# Patient Record
Sex: Female | Born: 1935 | Race: White | Hispanic: No | Marital: Married | Smoking: Never smoker
Health system: Southern US, Community
[De-identification: ages and names within clinical notes are randomized; demographics above are authoritative.]

## PROBLEM LIST (undated history)

## (undated) DIAGNOSIS — H409 Unspecified glaucoma: Secondary | ICD-10-CM

## (undated) DIAGNOSIS — E039 Hypothyroidism, unspecified: Secondary | ICD-10-CM

## (undated) DIAGNOSIS — I10 Essential (primary) hypertension: Secondary | ICD-10-CM

## (undated) DIAGNOSIS — E7800 Pure hypercholesterolemia, unspecified: Secondary | ICD-10-CM

## (undated) DIAGNOSIS — G629 Polyneuropathy, unspecified: Secondary | ICD-10-CM

## (undated) DIAGNOSIS — R002 Palpitations: Secondary | ICD-10-CM

## (undated) DIAGNOSIS — M810 Age-related osteoporosis without current pathological fracture: Secondary | ICD-10-CM

## (undated) HISTORY — PX: CLAVICLE SURGERY: SHX598

## (undated) HISTORY — PX: TONSILLECTOMY: SUR1361

## (undated) HISTORY — PX: COLONOSCOPY: SHX174

---

## 2004-09-10 ENCOUNTER — Ambulatory Visit: Payer: Self-pay | Admitting: Internal Medicine

## 2005-09-17 ENCOUNTER — Ambulatory Visit: Payer: Self-pay | Admitting: Internal Medicine

## 2006-09-24 ENCOUNTER — Ambulatory Visit: Payer: Self-pay | Admitting: Internal Medicine

## 2007-09-25 ENCOUNTER — Ambulatory Visit: Payer: Self-pay | Admitting: Internal Medicine

## 2008-09-27 ENCOUNTER — Ambulatory Visit: Payer: Self-pay | Admitting: Internal Medicine

## 2009-05-17 ENCOUNTER — Ambulatory Visit: Payer: Self-pay | Admitting: Podiatry

## 2009-09-28 ENCOUNTER — Ambulatory Visit: Payer: Self-pay | Admitting: Internal Medicine

## 2011-01-30 ENCOUNTER — Ambulatory Visit: Payer: Self-pay | Admitting: Internal Medicine

## 2012-02-03 ENCOUNTER — Ambulatory Visit: Payer: Self-pay | Admitting: Internal Medicine

## 2013-02-03 ENCOUNTER — Ambulatory Visit: Payer: Self-pay | Admitting: Internal Medicine

## 2014-02-07 ENCOUNTER — Ambulatory Visit: Payer: Self-pay | Admitting: Internal Medicine

## 2014-09-12 ENCOUNTER — Other Ambulatory Visit: Payer: Self-pay | Admitting: Internal Medicine

## 2014-09-12 DIAGNOSIS — Z1231 Encounter for screening mammogram for malignant neoplasm of breast: Secondary | ICD-10-CM

## 2015-01-25 DIAGNOSIS — H401132 Primary open-angle glaucoma, bilateral, moderate stage: Secondary | ICD-10-CM | POA: Diagnosis not present

## 2015-02-14 ENCOUNTER — Ambulatory Visit
Admission: RE | Admit: 2015-02-14 | Discharge: 2015-02-14 | Disposition: A | Discharge status: Home / Self Care | Payer: PPO | Source: Ambulatory Visit | Attending: Internal Medicine | Admitting: Internal Medicine

## 2015-02-14 DIAGNOSIS — Z1231 Encounter for screening mammogram for malignant neoplasm of breast: Secondary | ICD-10-CM | POA: Insufficient documentation

## 2015-03-27 DIAGNOSIS — J40 Bronchitis, not specified as acute or chronic: Secondary | ICD-10-CM | POA: Diagnosis not present

## 2015-04-06 DIAGNOSIS — E782 Mixed hyperlipidemia: Secondary | ICD-10-CM | POA: Diagnosis not present

## 2015-04-06 DIAGNOSIS — S92309D Fracture of unspecified metatarsal bone(s), unspecified foot, subsequent encounter for fracture with routine healing: Secondary | ICD-10-CM | POA: Diagnosis not present

## 2015-04-06 DIAGNOSIS — G609 Hereditary and idiopathic neuropathy, unspecified: Secondary | ICD-10-CM | POA: Diagnosis not present

## 2015-04-12 DIAGNOSIS — I10 Essential (primary) hypertension: Secondary | ICD-10-CM | POA: Diagnosis not present

## 2015-04-12 DIAGNOSIS — E782 Mixed hyperlipidemia: Secondary | ICD-10-CM | POA: Diagnosis not present

## 2015-07-31 DIAGNOSIS — H401132 Primary open-angle glaucoma, bilateral, moderate stage: Secondary | ICD-10-CM | POA: Diagnosis not present

## 2015-08-29 DIAGNOSIS — H401132 Primary open-angle glaucoma, bilateral, moderate stage: Secondary | ICD-10-CM | POA: Diagnosis not present

## 2015-10-10 DIAGNOSIS — E782 Mixed hyperlipidemia: Secondary | ICD-10-CM | POA: Diagnosis not present

## 2015-10-10 DIAGNOSIS — I10 Essential (primary) hypertension: Secondary | ICD-10-CM | POA: Diagnosis not present

## 2015-10-18 ENCOUNTER — Other Ambulatory Visit: Payer: Self-pay | Admitting: Internal Medicine

## 2015-10-18 DIAGNOSIS — Z0000 Encounter for general adult medical examination without abnormal findings: Secondary | ICD-10-CM | POA: Diagnosis not present

## 2015-10-18 DIAGNOSIS — Z1231 Encounter for screening mammogram for malignant neoplasm of breast: Secondary | ICD-10-CM

## 2015-10-18 DIAGNOSIS — Z23 Encounter for immunization: Secondary | ICD-10-CM | POA: Diagnosis not present

## 2016-02-15 ENCOUNTER — Ambulatory Visit
Admission: RE | Admit: 2016-02-15 | Discharge: 2016-02-15 | Disposition: A | Discharge status: Home / Self Care | Payer: PPO | Source: Ambulatory Visit | Attending: Internal Medicine | Admitting: Internal Medicine

## 2016-02-15 DIAGNOSIS — Z1231 Encounter for screening mammogram for malignant neoplasm of breast: Secondary | ICD-10-CM | POA: Diagnosis not present

## 2016-02-28 DIAGNOSIS — H401132 Primary open-angle glaucoma, bilateral, moderate stage: Secondary | ICD-10-CM | POA: Diagnosis not present

## 2016-03-13 DIAGNOSIS — H2513 Age-related nuclear cataract, bilateral: Secondary | ICD-10-CM | POA: Diagnosis not present

## 2016-03-26 ENCOUNTER — Encounter: Payer: Self-pay | Admitting: *Deleted

## 2016-03-29 NOTE — Discharge Instructions (Signed)
Cataract Surgery, Care After °Refer to this sheet in the next few weeks. These instructions provide you with information about caring for yourself after your procedure. Your health care provider may also give you more specific instructions. Your treatment has been planned according to current medical practices, but problems sometimes occur. Call your health care provider if you have any problems or questions after your procedure. °What can I expect after the procedure? °After the procedure, it is common to have: °· Itching. °· Discomfort. °· Fluid discharge. °· Sensitivity to light and to touch. °· Bruising. °Follow these instructions at home: °Eye Care  °· Check your eye every day for signs of infection. Watch for: °¨ Redness, swelling, or pain. °¨ Fluid, blood, or pus. °¨ Warmth. °¨ Bad smell. °Activity  °· Avoid strenuous activities, such as playing contact sports, for as long as told by your health care provider. °· Do not drive or operate heavy machinery until your health care provider approves. °· Do not bend or lift heavy objects . Bending increases pressure in the eye. You can walk, climb stairs, and do light household chores. °· Ask your health care provider when you can return to work. If you work in a dusty environment, you may be advised to wear protective eyewear for a period of time. °General instructions  °· Take or apply over-the-counter and prescription medicines only as told by your health care provider. This includes eye drops. °· Do not touch or rub your eyes. °· If you were given a protective shield, wear it as told by your health care provider. If you were not given a protective shield, wear sunglasses as told by your health care provider to protect your eyes. °· Keep the area around your eye clean and dry. Avoid swimming or allowing water to hit you directly in the face while showering until told by your health care provider. Keep soap and shampoo out of your eyes. °· Do not put a contact lens  into the affected eye or eyes until your health care provider approves. °· Keep all follow-up visits as told by your health care provider. This is important. °Contact a health care provider if: ° °· You have increased bruising around your eye. °· You have pain that is not helped with medicine. °· You have a fever. °· You have redness, swelling, or pain in your eye. °· You have fluid, blood, or pus coming from your incision. °· Your vision gets worse. °Get help right away if: °· You have sudden vision loss. °This information is not intended to replace advice given to you by your health care provider. Make sure you discuss any questions you have with your health care provider. °Document Released: 07/27/2004 Document Revised: 05/18/2015 Document Reviewed: 11/17/2014 °Elsevier Interactive Patient Education © 2017 [employer] ° ° ° ° °General Anesthesia, Adult, Care After °These instructions provide you with information about caring for yourself after your procedure. Your health care provider may also give you more specific instructions. Your treatment has been planned according to current medical practices, but problems sometimes occur. Call your health care provider if you have any problems or questions after your procedure. °What can I expect after the procedure? °After the procedure, it is common to have: °· Vomiting. °· A sore throat. °· Mental slowness. °It is common to feel: °· Nauseous. °· Cold or shivery. °· Sleepy. °· Tired. °· Sore or achy, even in parts of your body where you did not have surgery. °Follow these instructions at   home: °For at least 24 hours after the procedure:  °· Do not: °¨ Participate in activities where you could fall or become injured. °¨ Drive. °¨ Use heavy machinery. °¨ Drink alcohol. °¨ Take sleeping pills or medicines that cause drowsiness. °¨ Make important decisions or sign legal documents. °¨ Take care of children on your own. °· Rest. °Eating and drinking  °· If you vomit, drink  water, juice, or soup when you can drink without vomiting. °· Drink enough fluid to keep your urine clear or pale yellow. °· Make sure you have little or no nausea before eating solid foods. °· Follow the diet recommended by your health care provider. °General instructions  °· Have a responsible adult stay with you until you are awake and alert. °· Return to your normal activities as told by your health care provider. Ask your health care provider what activities are safe for you. °· Take over-the-counter and prescription medicines only as told by your health care provider. °· If you smoke, do not smoke without supervision. °· Keep all follow-up visits as told by your health care provider. This is important. °Contact a health care provider if: °· You continue to have nausea or vomiting at home, and medicines are not helpful. °· You cannot drink fluids or start eating again. °· You cannot urinate after 8-12 hours. °· You develop a skin rash. °· You have fever. °· You have increasing redness at the site of your procedure. °Get help right away if: °· You have difficulty breathing. °· You have chest pain. °· You have unexpected bleeding. °· You feel that you are having a life-threatening or urgent problem. °This information is not intended to replace advice given to you by your health care provider. Make sure you discuss any questions you have with your health care provider. °Document Released: 04/15/2000 Document Revised: 06/12/2015 Document Reviewed: 12/22/2014 °Elsevier Interactive Patient Education © 2017 [employer] ° °

## 2016-04-01 ENCOUNTER — Ambulatory Visit: Payer: PPO | Admitting: Anesthesiology

## 2016-04-01 ENCOUNTER — Encounter
Admission: RE | Disposition: A | Discharge status: Home / Self Care | Payer: Self-pay | Source: Ambulatory Visit | Attending: Ophthalmology

## 2016-04-01 ENCOUNTER — Ambulatory Visit
Admission: RE | Admit: 2016-04-01 | Discharge: 2016-04-01 | Disposition: A | Discharge status: Home / Self Care | Payer: PPO | Source: Ambulatory Visit | Attending: Ophthalmology | Admitting: Ophthalmology

## 2016-04-01 DIAGNOSIS — Z882 Allergy status to sulfonamides status: Secondary | ICD-10-CM | POA: Insufficient documentation

## 2016-04-01 DIAGNOSIS — Z888 Allergy status to other drugs, medicaments and biological substances status: Secondary | ICD-10-CM | POA: Diagnosis not present

## 2016-04-01 DIAGNOSIS — I499 Cardiac arrhythmia, unspecified: Secondary | ICD-10-CM | POA: Diagnosis not present

## 2016-04-01 DIAGNOSIS — Z881 Allergy status to other antibiotic agents status: Secondary | ICD-10-CM | POA: Insufficient documentation

## 2016-04-01 DIAGNOSIS — H2513 Age-related nuclear cataract, bilateral: Secondary | ICD-10-CM | POA: Diagnosis not present

## 2016-04-01 DIAGNOSIS — E039 Hypothyroidism, unspecified: Secondary | ICD-10-CM | POA: Insufficient documentation

## 2016-04-01 DIAGNOSIS — M1990 Unspecified osteoarthritis, unspecified site: Secondary | ICD-10-CM | POA: Diagnosis not present

## 2016-04-01 DIAGNOSIS — Z880 Allergy status to penicillin: Secondary | ICD-10-CM | POA: Insufficient documentation

## 2016-04-01 DIAGNOSIS — M810 Age-related osteoporosis without current pathological fracture: Secondary | ICD-10-CM | POA: Insufficient documentation

## 2016-04-01 DIAGNOSIS — I10 Essential (primary) hypertension: Secondary | ICD-10-CM | POA: Insufficient documentation

## 2016-04-01 DIAGNOSIS — E7800 Pure hypercholesterolemia, unspecified: Secondary | ICD-10-CM | POA: Diagnosis not present

## 2016-04-01 DIAGNOSIS — R002 Palpitations: Secondary | ICD-10-CM | POA: Insufficient documentation

## 2016-04-01 DIAGNOSIS — H2511 Age-related nuclear cataract, right eye: Secondary | ICD-10-CM | POA: Diagnosis not present

## 2016-04-01 HISTORY — DX: Pure hypercholesterolemia, unspecified: E78.00

## 2016-04-01 HISTORY — DX: Hypothyroidism, unspecified: E03.9

## 2016-04-01 HISTORY — DX: Age-related osteoporosis without current pathological fracture: M81.0

## 2016-04-01 HISTORY — DX: Essential (primary) hypertension: I10

## 2016-04-01 HISTORY — PX: CATARACT EXTRACTION W/PHACO: SHX586

## 2016-04-01 HISTORY — DX: Polyneuropathy, unspecified: G62.9

## 2016-04-01 HISTORY — DX: Unspecified glaucoma: H40.9

## 2016-04-01 HISTORY — DX: Palpitations: R00.2

## 2016-04-01 SURGERY — PHACOEMULSIFICATION, CATARACT, WITH IOL INSERTION
Anesthesia: Monitor Anesthesia Care | Site: Eye | Laterality: Right | Wound class: Clean

## 2016-04-01 MED ORDER — EPINEPHRINE PF 1 MG/ML IJ SOLN
INTRAOCULAR | Status: DC | PRN
  Administered 2016-04-01: 123 mL via OPHTHALMIC

## 2016-04-01 MED ORDER — FENTANYL CITRATE (PF) 100 MCG/2ML IJ SOLN
INTRAMUSCULAR | Status: DC | PRN
  Administered 2016-04-01: 50 ug via INTRAVENOUS

## 2016-04-01 MED ORDER — ACETAMINOPHEN 325 MG PO TABS: 325.0000 mg | ORAL_TABLET | ORAL | Status: DC | PRN

## 2016-04-01 MED ORDER — MIDAZOLAM HCL 2 MG/2ML IJ SOLN
INTRAMUSCULAR | Status: DC | PRN
  Administered 2016-04-01: 2 mg via INTRAVENOUS

## 2016-04-01 MED ORDER — LIDOCAINE HCL (PF) 2 % IJ SOLN
INTRAMUSCULAR | Status: DC | PRN
  Administered 2016-04-01: 1 mL

## 2016-04-01 MED ORDER — BRIMONIDINE TARTRATE-TIMOLOL 0.2-0.5 % OP SOLN
OPHTHALMIC | Status: DC | PRN
  Administered 2016-04-01: 1 [drp] via OPHTHALMIC

## 2016-04-01 MED ORDER — NA HYALUR & NA CHOND-NA HYALUR 0.4-0.35 ML IO KIT
PACK | INTRAOCULAR | Status: DC | PRN
  Administered 2016-04-01: 1 mL via INTRAOCULAR

## 2016-04-01 MED ORDER — MOXIFLOXACIN HCL 0.5 % OP SOLN
OPHTHALMIC | Status: DC | PRN
  Administered 2016-04-01: .3 mL via OPHTHALMIC

## 2016-04-01 MED ORDER — ACETAMINOPHEN 160 MG/5ML PO SOLN: 325.0000 mg | ORAL | Status: DC | PRN

## 2016-04-01 MED ORDER — ARMC OPHTHALMIC DILATING DROPS
1.0000 "application " | OPHTHALMIC | Status: DC | PRN
  Administered 2016-04-01 (×3): 1 via OPHTHALMIC

## 2016-04-01 MED ORDER — MOXIFLOXACIN HCL 0.5 % OP SOLN
1.0000 [drp] | OPHTHALMIC | Status: DC | PRN
  Administered 2016-04-01 (×3): 1 [drp] via OPHTHALMIC

## 2016-04-01 SURGICAL SUPPLY — 23 items
APPLICATOR COTTON TIP 3IN (MISCELLANEOUS) ×3 IMPLANT
CANNULA ANT/CHMB 27GA (MISCELLANEOUS) ×3 IMPLANT
DISSECTOR HYDRO NUCLEUS 50X22 (MISCELLANEOUS) ×3 IMPLANT
GLOVE BIO SURGEON STRL SZ7 (GLOVE) ×3 IMPLANT
GLOVE SURG LX 6.5 MICRO (GLOVE) ×2
GLOVE SURG LX STRL 6.5 MICRO (GLOVE) ×1 IMPLANT
GOWN STRL REUS W/ TWL LRG LVL3 (GOWN DISPOSABLE) ×2 IMPLANT
GOWN STRL REUS W/TWL LRG LVL3 (GOWN DISPOSABLE) ×4
LENS IOL ACRSF IQ ULTRA 24.5 (Intraocular Lens) ×1 IMPLANT
LENS IOL ACRYSOF IQ 24.5 (Intraocular Lens) ×3 IMPLANT
MARKER SKIN DUAL TIP RULER LAB (MISCELLANEOUS) ×3 IMPLANT
PACK CATARACT BRASINGTON (MISCELLANEOUS) ×3 IMPLANT
PACK EYE AFTER SURG (MISCELLANEOUS) ×3 IMPLANT
PACK OPTHALMIC (MISCELLANEOUS) ×3 IMPLANT
RING MALYGIN 7.0 (MISCELLANEOUS) IMPLANT
SUT ETHILON 10-0 CS-B-6CS-B-6 (SUTURE)
SUT VICRYL  9 0 (SUTURE)
SUT VICRYL 9 0 (SUTURE) IMPLANT
SUTURE EHLN 10-0 CS-B-6CS-B-6 (SUTURE) IMPLANT
SYR TB 1ML LUER SLIP (SYRINGE) ×3 IMPLANT
WATER STERILE IRR 250ML POUR (IV SOLUTION) ×3 IMPLANT
WICK EYE OCUCEL (MISCELLANEOUS) IMPLANT
WIPE NON LINTING 3.25X3.25 (MISCELLANEOUS) ×3 IMPLANT

## 2016-04-01 NOTE — Anesthesia Procedure Notes (Signed)
Procedure Name: MAC Performed by: Mahogany Torrance Pre-anesthesia Checklist: Patient identified, Emergency Drugs available, Suction available, Timeout performed and Patient being monitored Patient Re-evaluated:Patient Re-evaluated prior to inductionOxygen Delivery Method: Nasal cannula Placement Confirmation: positive ETCO2     

## 2016-04-01 NOTE — Transfer of Care (Signed)
Immediate Anesthesia Transfer of Care Note  Patient: Erika Carter  Procedure(s) Performed: Procedure(s): CATARACT EXTRACTION PHACO AND INTRAOCULAR LENS PLACEMENT (IOC)  Right (Right)  Patient Location: PACU  Anesthesia Type: MAC  Level of Consciousness: awake, alert  and patient cooperative  Airway and Oxygen Therapy: Patient Spontanous Breathing and Patient connected to supplemental oxygen  Post-op Assessment: Post-op Vital signs reviewed, Patient's Cardiovascular Status Stable, Respiratory Function Stable, Patent Airway and No signs of Nausea or vomiting  Post-op Vital Signs: Reviewed and stable  Complications: No apparent anesthesia complications

## 2016-04-01 NOTE — Anesthesia Postprocedure Evaluation (Signed)
Anesthesia Post Note  Patient: Erika Carter  Procedure(s) Performed: Procedure(s) (LRB): CATARACT EXTRACTION PHACO AND INTRAOCULAR LENS PLACEMENT (IOC)  Right (Right)  Patient location during evaluation: PACU Anesthesia Type: MAC Level of consciousness: awake and alert Pain management: pain level controlled Vital Signs Assessment: post-procedure vital signs reviewed and stable Respiratory status: spontaneous breathing, nonlabored ventilation, respiratory function stable and patient connected to nasal cannula oxygen Cardiovascular status: stable and blood pressure returned to baseline Anesthetic complications: no    Alisa Graff

## 2016-04-01 NOTE — Op Note (Signed)
Date of Surgery: 04/01/2016  PREOPERATIVE DIAGNOSES: Visually significant nuclear sclerotic cataract, right eye.  POSTOPERATIVE DIAGNOSES: Same  PROCEDURES PERFORMED: Cataract extraction with intraocular lens implant, right eye.  SURGEON: Almon Hercules, M.D.  ANESTHESIA: MAC and topical  IMPLANTS: [id number] +24.5 D  Implant Name Type Inv. Item Serial No. Manufacturer Lot No. LRB No. Used  LENS IOL ACRYSOF IQ 24.5 - [id number] Intraocular Lens LENS IOL ACRYSOF IQ 24.5 [id number] [employer]  Right 1  LENS IOL ACRYSOF IQ 24.5 - [id number] Intraocular Lens LENS IOL ACRYSOF IQ 24.5 [id number] ALCON   Right 1   Serial number [id number] was not implanted due to extended haptic prior to injection.   COMPLICATIONS: None.  DESCRIPTION OF PROCEDURE: Therapeutic options were discussed with the patient preoperatively, including a discussion of risks and benefits of surgery. Informed consent was obtained. An IOL-Master and immersion biometry were used to take the lens measurements, and a dilated fundus exam was performed within 6 months of the surgical date.  The patient was premedicated and brought to the operating room and placed on the operating table in the supine position. After adequate anesthesia, the patient was prepped and draped in the usual sterile ophthalmic fashion. A wire lid speculum was inserted and the microscope was positioned. A Superblade was used to create a paracentesis site at the limbus and a small amount of dilute preservative free lidocaine was instilled into the anterior chamber, followed by dispersive viscoelastic. A clear corneal incision was created temporally using a 2.4 mm keratome blade. Capsulorrhexis was then performed. In situ phacoemulsification was performed.  Cortical material was removed with the irrigation-aspiration unit. Dispersive viscoelastic was instilled to open the capsular bag. A posterior chamber intraocular lens with the specifications above was inserted  and positioned. Irrigation-aspiration was used to remove all viscoelastic. Cefuroxime 1cc was instilled into the anterior chamber, and the corneal incision was checked and found to be water tight. The eyelid speculum was removed.  The operative eye was covered with protective goggles after instilling 1 drop of timolol and brimonidine. The patient tolerated the procedure well. There were no complications.

## 2016-04-01 NOTE — Anesthesia Preprocedure Evaluation (Signed)
Anesthesia Evaluation  Patient identified by MRN, date of birth, ID band Patient awake    Reviewed: Allergy & Precautions, H&P , NPO status , Patient's Chart, lab work & pertinent test results, reviewed documented beta blocker date and time   Airway Mallampati: II  TM Distance: >3 FB Neck ROM: full    Dental no notable dental hx.    Pulmonary neg pulmonary ROS,    Pulmonary exam normal breath sounds clear to auscultation       Cardiovascular Exercise Tolerance: Good hypertension,  Rhythm:regular Rate:Normal     Neuro/Psych negative neurological ROS  negative psych ROS   GI/Hepatic negative GI ROS, Neg liver ROS,   Endo/Other  Hypothyroidism   Renal/GU negative Renal ROS  negative genitourinary   Musculoskeletal   Abdominal   Peds  Hematology negative hematology ROS (+)   Anesthesia Other Findings   Reproductive/Obstetrics negative OB ROS                             Anesthesia Physical Anesthesia Plan  ASA: II  Anesthesia Plan: MAC   Post-op Pain Management:    Induction:   Airway Management Planned:   Additional Equipment:   Intra-op Plan:   Post-operative Plan:   Informed Consent: I have reviewed the patients History and Physical, chart, labs and discussed the procedure including the risks, benefits and alternatives for the proposed anesthesia with the patient or authorized representative who has indicated his/her understanding and acceptance.   Dental Advisory Given  Plan Discussed with: CRNA  Anesthesia Plan Comments:         Anesthesia Quick Evaluation

## 2016-04-02 ENCOUNTER — Encounter: Payer: Self-pay | Admitting: Ophthalmology

## 2016-04-11 DIAGNOSIS — E782 Mixed hyperlipidemia: Secondary | ICD-10-CM | POA: Diagnosis not present

## 2016-04-11 DIAGNOSIS — Z0000 Encounter for general adult medical examination without abnormal findings: Secondary | ICD-10-CM | POA: Diagnosis not present

## 2016-04-18 DIAGNOSIS — E039 Hypothyroidism, unspecified: Secondary | ICD-10-CM | POA: Diagnosis not present

## 2016-04-18 DIAGNOSIS — E782 Mixed hyperlipidemia: Secondary | ICD-10-CM | POA: Diagnosis not present

## 2016-04-18 DIAGNOSIS — G609 Hereditary and idiopathic neuropathy, unspecified: Secondary | ICD-10-CM | POA: Diagnosis not present

## 2016-04-24 DIAGNOSIS — H2512 Age-related nuclear cataract, left eye: Secondary | ICD-10-CM | POA: Diagnosis not present

## 2016-04-30 ENCOUNTER — Encounter: Payer: Self-pay | Admitting: *Deleted

## 2016-05-03 NOTE — Discharge Instructions (Signed)
Cataract Surgery, Care After °Refer to this sheet in the next few weeks. These instructions provide you with information about caring for yourself after your procedure. Your health care provider may also give you more specific instructions. Your treatment has been planned according to current medical practices, but problems sometimes occur. Call your health care provider if you have any problems or questions after your procedure. °What can I expect after the procedure? °After the procedure, it is common to have: °· Itching. °· Discomfort. °· Fluid discharge. °· Sensitivity to light and to touch. °· Bruising. °Follow these instructions at home: °Eye Care  °· Check your eye every day for signs of infection. Watch for: °¨ Redness, swelling, or pain. °¨ Fluid, blood, or pus. °¨ Warmth. °¨ Bad smell. °Activity  °· Avoid strenuous activities, such as playing contact sports, for as long as told by your health care provider. °· Do not drive or operate heavy machinery until your health care provider approves. °· Do not bend or lift heavy objects . Bending increases pressure in the eye. You can walk, climb stairs, and do light household chores. °· Ask your health care provider when you can return to work. If you work in a dusty environment, you may be advised to wear protective eyewear for a period of time. °General instructions  °· Take or apply over-the-counter and prescription medicines only as told by your health care provider. This includes eye drops. °· Do not touch or rub your eyes. °· If you were given a protective shield, wear it as told by your health care provider. If you were not given a protective shield, wear sunglasses as told by your health care provider to protect your eyes. °· Keep the area around your eye clean and dry. Avoid swimming or allowing water to hit you directly in the face while showering until told by your health care provider. Keep soap and shampoo out of your eyes. °· Do not put a contact lens  into the affected eye or eyes until your health care provider approves. °· Keep all follow-up visits as told by your health care provider. This is important. °Contact a health care provider if: ° °· You have increased bruising around your eye. °· You have pain that is not helped with medicine. °· You have a fever. °· You have redness, swelling, or pain in your eye. °· You have fluid, blood, or pus coming from your incision. °· Your vision gets worse. °Get help right away if: °· You have sudden vision loss. °This information is not intended to replace advice given to you by your health care provider. Make sure you discuss any questions you have with your health care provider. °Document Released: 07/27/2004 Document Revised: 05/18/2015 Document Reviewed: 11/17/2014 °Elsevier Interactive Patient Education © 2017 [employer] ° ° ° ° °General Anesthesia, Adult, Care After °These instructions provide you with information about caring for yourself after your procedure. Your health care provider may also give you more specific instructions. Your treatment has been planned according to current medical practices, but problems sometimes occur. Call your health care provider if you have any problems or questions after your procedure. °What can I expect after the procedure? °After the procedure, it is common to have: °· Vomiting. °· A sore throat. °· Mental slowness. °It is common to feel: °· Nauseous. °· Cold or shivery. °· Sleepy. °· Tired. °· Sore or achy, even in parts of your body where you did not have surgery. °Follow these instructions at   home: °For at least 24 hours after the procedure:  °· Do not: °¨ Participate in activities where you could fall or become injured. °¨ Drive. °¨ Use heavy machinery. °¨ Drink alcohol. °¨ Take sleeping pills or medicines that cause drowsiness. °¨ Make important decisions or sign legal documents. °¨ Take care of children on your own. °· Rest. °Eating and drinking  °· If you vomit, drink  water, juice, or soup when you can drink without vomiting. °· Drink enough fluid to keep your urine clear or pale yellow. °· Make sure you have little or no nausea before eating solid foods. °· Follow the diet recommended by your health care provider. °General instructions  °· Have a responsible adult stay with you until you are awake and alert. °· Return to your normal activities as told by your health care provider. Ask your health care provider what activities are safe for you. °· Take over-the-counter and prescription medicines only as told by your health care provider. °· If you smoke, do not smoke without supervision. °· Keep all follow-up visits as told by your health care provider. This is important. °Contact a health care provider if: °· You continue to have nausea or vomiting at home, and medicines are not helpful. °· You cannot drink fluids or start eating again. °· You cannot urinate after 8-12 hours. °· You develop a skin rash. °· You have fever. °· You have increasing redness at the site of your procedure. °Get help right away if: °· You have difficulty breathing. °· You have chest pain. °· You have unexpected bleeding. °· You feel that you are having a life-threatening or urgent problem. °This information is not intended to replace advice given to you by your health care provider. Make sure you discuss any questions you have with your health care provider. °Document Released: 04/15/2000 Document Revised: 06/12/2015 Document Reviewed: 12/22/2014 °Elsevier Interactive Patient Education © 2017 [employer] ° °

## 2016-05-06 ENCOUNTER — Encounter
Admission: RE | Disposition: A | Discharge status: Home / Self Care | Payer: Self-pay | Source: Ambulatory Visit | Attending: Ophthalmology

## 2016-05-06 ENCOUNTER — Ambulatory Visit: Payer: PPO | Admitting: Anesthesiology

## 2016-05-06 ENCOUNTER — Ambulatory Visit
Admission: RE | Admit: 2016-05-06 | Discharge: 2016-05-06 | Disposition: A | Discharge status: Home / Self Care | Payer: PPO | Source: Ambulatory Visit | Attending: Ophthalmology | Admitting: Ophthalmology

## 2016-05-06 DIAGNOSIS — H2512 Age-related nuclear cataract, left eye: Secondary | ICD-10-CM | POA: Diagnosis not present

## 2016-05-06 DIAGNOSIS — I10 Essential (primary) hypertension: Secondary | ICD-10-CM | POA: Insufficient documentation

## 2016-05-06 DIAGNOSIS — E039 Hypothyroidism, unspecified: Secondary | ICD-10-CM | POA: Insufficient documentation

## 2016-05-06 HISTORY — PX: CATARACT EXTRACTION W/PHACO: SHX586

## 2016-05-06 SURGERY — PHACOEMULSIFICATION, CATARACT, WITH IOL INSERTION
Anesthesia: Monitor Anesthesia Care | Site: Eye | Laterality: Left | Wound class: Clean

## 2016-05-06 MED ORDER — MOXIFLOXACIN HCL 0.5 % OP SOLN
OPHTHALMIC | Status: DC | PRN
  Administered 2016-05-06: .2 mL via OPHTHALMIC

## 2016-05-06 MED ORDER — NA HYALUR & NA CHOND-NA HYALUR 0.4-0.35 ML IO KIT
PACK | INTRAOCULAR | Status: DC | PRN
  Administered 2016-05-06: 1 mL via INTRAOCULAR

## 2016-05-06 MED ORDER — BRIMONIDINE TARTRATE-TIMOLOL 0.2-0.5 % OP SOLN
OPHTHALMIC | Status: DC | PRN
  Administered 2016-05-06: 1 [drp] via OPHTHALMIC

## 2016-05-06 MED ORDER — MOXIFLOXACIN HCL 0.5 % OP SOLN
1.0000 [drp] | OPHTHALMIC | Status: DC | PRN
  Administered 2016-05-06 (×3): 1 [drp] via OPHTHALMIC

## 2016-05-06 MED ORDER — FENTANYL CITRATE (PF) 100 MCG/2ML IJ SOLN
INTRAMUSCULAR | Status: DC | PRN
  Administered 2016-05-06: 50 ug via INTRAVENOUS

## 2016-05-06 MED ORDER — MIDAZOLAM HCL 2 MG/2ML IJ SOLN
INTRAMUSCULAR | Status: DC | PRN
  Administered 2016-05-06: 2 mg via INTRAVENOUS

## 2016-05-06 MED ORDER — ARMC OPHTHALMIC DILATING DROPS
1.0000 "application " | OPHTHALMIC | Status: DC | PRN
  Administered 2016-05-06 (×3): 1 via OPHTHALMIC

## 2016-05-06 MED ORDER — LACTATED RINGERS IV SOLN: INTRAVENOUS | Status: DC

## 2016-05-06 MED ORDER — ACETAMINOPHEN 160 MG/5ML PO SOLN: 325.0000 mg | ORAL | Status: DC | PRN

## 2016-05-06 MED ORDER — ACETAMINOPHEN 325 MG PO TABS: 325.0000 mg | ORAL_TABLET | ORAL | Status: DC | PRN

## 2016-05-06 MED ORDER — CEFUROXIME OPHTHALMIC INJECTION 1 MG/0.1 ML
INJECTION | OPHTHALMIC | Status: DC | PRN
  Administered 2016-05-06: .2 mL via OPHTHALMIC

## 2016-05-06 MED ORDER — EPINEPHRINE PF 1 MG/ML IJ SOLN
INTRAMUSCULAR | Status: DC | PRN
  Administered 2016-05-06: 99 mL via OPHTHALMIC

## 2016-05-06 SURGICAL SUPPLY — 24 items
APPLICATOR COTTON TIP 3IN (MISCELLANEOUS) ×3 IMPLANT
CANNULA ANT/CHMB 27GA (MISCELLANEOUS) ×3 IMPLANT
DISSECTOR HYDRO NUCLEUS 50X22 (MISCELLANEOUS) ×3 IMPLANT
GLOVE BIO SURGEON STRL SZ7 (GLOVE) ×3 IMPLANT
GLOVE SURG LX 6.5 MICRO (GLOVE) ×2
GLOVE SURG LX STRL 6.5 MICRO (GLOVE) ×1 IMPLANT
GOWN STRL REUS W/ TWL LRG LVL3 (GOWN DISPOSABLE) ×2 IMPLANT
GOWN STRL REUS W/TWL LRG LVL3 (GOWN DISPOSABLE) ×4
LENS IOL ACRSF IQ ULTRA 25.0 (Intraocular Lens) ×1 IMPLANT
LENS IOL ACRYSOF IQ 25.0 (Intraocular Lens) ×3 IMPLANT
MARKER SKIN DUAL TIP RULER LAB (MISCELLANEOUS) ×3 IMPLANT
PACK CATARACT BRASINGTON (MISCELLANEOUS) ×3 IMPLANT
PACK EYE AFTER SURG (MISCELLANEOUS) ×3 IMPLANT
PACK OPTHALMIC (MISCELLANEOUS) ×3 IMPLANT
RING MALYGIN 7.0 (MISCELLANEOUS) IMPLANT
SUT ETHILON 10-0 CS-B-6CS-B-6 (SUTURE)
SUT VICRYL  9 0 (SUTURE)
SUT VICRYL 9 0 (SUTURE) IMPLANT
SUTURE EHLN 10-0 CS-B-6CS-B-6 (SUTURE) IMPLANT
SYR TB 1ML LUER SLIP (SYRINGE) ×3 IMPLANT
WATER STERILE IRR 250ML POUR (IV SOLUTION) IMPLANT
WATER STERILE IRR 500ML POUR (IV SOLUTION) ×3 IMPLANT
WICK EYE OCUCEL (MISCELLANEOUS) IMPLANT
WIPE NON LINTING 3.25X3.25 (MISCELLANEOUS) ×3 IMPLANT

## 2016-05-06 NOTE — Anesthesia Procedure Notes (Signed)
Procedure Name: MAC Performed by: Mayme Genta Pre-anesthesia Checklist: Patient identified, Emergency Drugs available, Suction available, Timeout performed and Patient being monitored Patient Re-evaluated:Patient Re-evaluated prior to inductionOxygen Delivery Method: Nasal cannula Placement Confirmation: positive ETCO2

## 2016-05-06 NOTE — Transfer of Care (Signed)
Immediate Anesthesia Transfer of Care Note  Patient: Erika Carter  Procedure(s) Performed: Procedure(s): CATARACT EXTRACTION PHACO AND INTRAOCULAR LENS PLACEMENT (IOC)  Left (Left)  Patient Location: PACU  Anesthesia Type: MAC  Level of Consciousness: awake, alert  and patient cooperative  Airway and Oxygen Therapy: Patient Spontanous Breathing and Patient connected to supplemental oxygen  Post-op Assessment: Post-op Vital signs reviewed, Patient's Cardiovascular Status Stable, Respiratory Function Stable, Patent Airway and No signs of Nausea or vomiting  Post-op Vital Signs: Reviewed and stable  Complications: No apparent anesthesia complications

## 2016-05-06 NOTE — H&P (Signed)
H+P reviewed and is up to date, please see paper chart.  

## 2016-05-06 NOTE — Op Note (Signed)
Date of Surgery: 05/06/2016  PREOPERATIVE DIAGNOSES: Visually significant nuclear sclerotic cataract, left eye.  POSTOPERATIVE DIAGNOSES: Same  PROCEDURES PERFORMED: Cataract extraction with intraocular lens implant, left eye.  SURGEON: Almon Hercules, M.D.  ANESTHESIA: MAC and topical  IMPLANTS: [id number] +25.0 D  Implant Name Type Inv. Item Serial No. Manufacturer Lot No. LRB No. Used  LENS IOL ACRYSOF IQ 25.0 - [id number] Intraocular Lens LENS IOL ACRYSOF IQ 25.0 [id number] [employer] [id number] Left 1    COMPLICATIONS: None.  DESCRIPTION OF PROCEDURE: Therapeutic options were discussed with the patient preoperatively, including a discussion of risks and benefits of surgery. Informed consent was obtained. An IOL-Master and immersion biometry were used to take the lens measurements, and a dilated fundus exam was performed within 6 months of the surgical date.  The patient was premedicated and brought to the operating room and placed on the operating table in the supine position. After adequate anesthesia, the patient was prepped and draped in the usual sterile ophthalmic fashion. A wire lid speculum was inserted and the microscope was positioned. A Superblade was used to create a paracentesis site at the limbus and a small amount of dilute preservative free lidocaine was instilled into the anterior chamber, followed by dispersive viscoelastic. A clear corneal incision was created temporally using a 2.4 mm keratome blade. Capsulorrhexis was then performed. In situ phacoemulsification was performed.  Cortical material was removed with the irrigation-aspiration unit. Dispersive viscoelastic was instilled to open the capsular bag. A posterior chamber intraocular lens with the specifications above was inserted and positioned. Irrigation-aspiration was used to remove all viscoelastic. Vigamox 1cc was instilled into the anterior chamber, and the corneal incision was checked and found to be water  tight. The eyelid speculum was removed.  The operative eye was covered with protective goggles after instilling 1 drop of timolol and brimonidine. The patient tolerated the procedure well. There were no complications.

## 2016-05-06 NOTE — Anesthesia Preprocedure Evaluation (Signed)
Anesthesia Evaluation  Patient identified by MRN, date of birth, ID band Patient awake    Reviewed: Allergy & Precautions, H&P , NPO status , Patient's Chart, lab work & pertinent test results, reviewed documented beta blocker date and time   Airway Mallampati: II  TM Distance: >3 FB Neck ROM: full    Dental no notable dental hx.    Pulmonary neg pulmonary ROS,    Pulmonary exam normal breath sounds clear to auscultation       Cardiovascular Exercise Tolerance: Good hypertension,  Rhythm:regular Rate:Normal     Neuro/Psych negative neurological ROS  negative psych ROS   GI/Hepatic negative GI ROS, Neg liver ROS,   Endo/Other  Hypothyroidism   Renal/GU negative Renal ROS  negative genitourinary   Musculoskeletal   Abdominal   Peds  Hematology negative hematology ROS (+)   Anesthesia Other Findings   Reproductive/Obstetrics negative OB ROS                             Anesthesia Physical  Anesthesia Plan  ASA: II  Anesthesia Plan: MAC   Post-op Pain Management:    Induction:   Airway Management Planned:   Additional Equipment:   Intra-op Plan:   Post-operative Plan:   Informed Consent: I have reviewed the patients History and Physical, chart, labs and discussed the procedure including the risks, benefits and alternatives for the proposed anesthesia with the patient or authorized representative who has indicated his/her understanding and acceptance.   Dental Advisory Given  Plan Discussed with: CRNA  Anesthesia Plan Comments:         Anesthesia Quick Evaluation

## 2016-05-06 NOTE — Anesthesia Postprocedure Evaluation (Signed)
Anesthesia Post Note  Patient: Erika Carter  Procedure(s) Performed: Procedure(s) (LRB): CATARACT EXTRACTION PHACO AND INTRAOCULAR LENS PLACEMENT (IOC)  Left (Left)  Patient location during evaluation: PACU Anesthesia Type: MAC Level of consciousness: awake and alert and oriented Pain management: satisfactory to patient Vital Signs Assessment: post-procedure vital signs reviewed and stable Respiratory status: spontaneous breathing, nonlabored ventilation and respiratory function stable Cardiovascular status: blood pressure returned to baseline and stable Postop Assessment: Adequate PO intake and No signs of nausea or vomiting Anesthetic complications: no    Raliegh Ip

## 2016-05-07 ENCOUNTER — Encounter: Payer: Self-pay | Admitting: Ophthalmology

## 2016-10-17 DIAGNOSIS — E782 Mixed hyperlipidemia: Secondary | ICD-10-CM | POA: Diagnosis not present

## 2016-10-17 DIAGNOSIS — E039 Hypothyroidism, unspecified: Secondary | ICD-10-CM | POA: Diagnosis not present

## 2016-10-24 ENCOUNTER — Other Ambulatory Visit: Payer: Self-pay | Admitting: Internal Medicine

## 2016-10-24 DIAGNOSIS — Z780 Asymptomatic menopausal state: Secondary | ICD-10-CM | POA: Diagnosis not present

## 2016-10-24 DIAGNOSIS — E782 Mixed hyperlipidemia: Secondary | ICD-10-CM | POA: Diagnosis not present

## 2016-10-24 DIAGNOSIS — E039 Hypothyroidism, unspecified: Secondary | ICD-10-CM | POA: Diagnosis not present

## 2016-10-24 DIAGNOSIS — Z23 Encounter for immunization: Secondary | ICD-10-CM | POA: Diagnosis not present

## 2016-10-24 DIAGNOSIS — Z0000 Encounter for general adult medical examination without abnormal findings: Secondary | ICD-10-CM | POA: Diagnosis not present

## 2016-10-24 DIAGNOSIS — Z1231 Encounter for screening mammogram for malignant neoplasm of breast: Secondary | ICD-10-CM

## 2016-11-05 DIAGNOSIS — M8588 Other specified disorders of bone density and structure, other site: Secondary | ICD-10-CM | POA: Diagnosis not present

## 2016-11-19 DIAGNOSIS — J00 Acute nasopharyngitis [common cold]: Secondary | ICD-10-CM | POA: Diagnosis not present

## 2016-12-04 DIAGNOSIS — H401132 Primary open-angle glaucoma, bilateral, moderate stage: Secondary | ICD-10-CM | POA: Diagnosis not present

## 2017-02-04 DIAGNOSIS — R253 Fasciculation: Secondary | ICD-10-CM | POA: Diagnosis not present

## 2017-02-20 ENCOUNTER — Ambulatory Visit
Admission: RE | Admit: 2017-02-20 | Discharge: 2017-02-20 | Disposition: A | Discharge status: Home / Self Care | Payer: PPO | Source: Ambulatory Visit | Attending: Internal Medicine | Admitting: Internal Medicine

## 2017-02-20 DIAGNOSIS — Z1231 Encounter for screening mammogram for malignant neoplasm of breast: Secondary | ICD-10-CM | POA: Diagnosis not present

## 2017-04-16 DIAGNOSIS — E782 Mixed hyperlipidemia: Secondary | ICD-10-CM | POA: Diagnosis not present

## 2017-04-16 DIAGNOSIS — E039 Hypothyroidism, unspecified: Secondary | ICD-10-CM | POA: Diagnosis not present

## 2017-04-23 DIAGNOSIS — E782 Mixed hyperlipidemia: Secondary | ICD-10-CM | POA: Diagnosis not present

## 2017-04-23 DIAGNOSIS — E039 Hypothyroidism, unspecified: Secondary | ICD-10-CM | POA: Diagnosis not present

## 2017-04-23 DIAGNOSIS — Z79899 Other long term (current) drug therapy: Secondary | ICD-10-CM | POA: Diagnosis not present

## 2017-04-23 DIAGNOSIS — Z0000 Encounter for general adult medical examination without abnormal findings: Secondary | ICD-10-CM | POA: Diagnosis not present

## 2017-06-05 DIAGNOSIS — H401132 Primary open-angle glaucoma, bilateral, moderate stage: Secondary | ICD-10-CM | POA: Diagnosis not present

## 2017-06-09 DIAGNOSIS — H401132 Primary open-angle glaucoma, bilateral, moderate stage: Secondary | ICD-10-CM | POA: Diagnosis not present

## 2017-06-24 DIAGNOSIS — E039 Hypothyroidism, unspecified: Secondary | ICD-10-CM | POA: Diagnosis not present

## 2017-10-21 DIAGNOSIS — Z79899 Other long term (current) drug therapy: Secondary | ICD-10-CM | POA: Diagnosis not present

## 2017-10-21 DIAGNOSIS — E782 Mixed hyperlipidemia: Secondary | ICD-10-CM | POA: Diagnosis not present

## 2017-10-21 DIAGNOSIS — E039 Hypothyroidism, unspecified: Secondary | ICD-10-CM | POA: Diagnosis not present

## 2017-10-28 DIAGNOSIS — Z23 Encounter for immunization: Secondary | ICD-10-CM | POA: Diagnosis not present

## 2017-10-28 DIAGNOSIS — Z0000 Encounter for general adult medical examination without abnormal findings: Secondary | ICD-10-CM | POA: Diagnosis not present

## 2017-10-28 DIAGNOSIS — E039 Hypothyroidism, unspecified: Secondary | ICD-10-CM | POA: Diagnosis not present

## 2017-10-28 DIAGNOSIS — E782 Mixed hyperlipidemia: Secondary | ICD-10-CM | POA: Diagnosis not present

## 2017-11-12 ENCOUNTER — Other Ambulatory Visit: Payer: Self-pay | Admitting: Internal Medicine

## 2017-11-12 DIAGNOSIS — Z1231 Encounter for screening mammogram for malignant neoplasm of breast: Secondary | ICD-10-CM

## 2017-12-09 DIAGNOSIS — H401132 Primary open-angle glaucoma, bilateral, moderate stage: Secondary | ICD-10-CM | POA: Diagnosis not present

## 2018-02-24 ENCOUNTER — Ambulatory Visit
Admission: RE | Admit: 2018-02-24 | Discharge: 2018-02-24 | Disposition: A | Discharge status: Home / Self Care | Payer: PPO | Source: Ambulatory Visit | Attending: Internal Medicine | Admitting: Internal Medicine

## 2018-02-24 DIAGNOSIS — Z1231 Encounter for screening mammogram for malignant neoplasm of breast: Secondary | ICD-10-CM

## 2018-03-05 DIAGNOSIS — R300 Dysuria: Secondary | ICD-10-CM | POA: Diagnosis not present

## 2018-09-25 DIAGNOSIS — E782 Mixed hyperlipidemia: Secondary | ICD-10-CM | POA: Diagnosis not present

## 2018-09-25 DIAGNOSIS — E039 Hypothyroidism, unspecified: Secondary | ICD-10-CM | POA: Diagnosis not present

## 2018-10-05 DIAGNOSIS — I493 Ventricular premature depolarization: Secondary | ICD-10-CM | POA: Diagnosis not present

## 2018-10-05 DIAGNOSIS — E782 Mixed hyperlipidemia: Secondary | ICD-10-CM | POA: Diagnosis not present

## 2018-10-05 DIAGNOSIS — Z0000 Encounter for general adult medical examination without abnormal findings: Secondary | ICD-10-CM | POA: Diagnosis not present

## 2018-11-05 DIAGNOSIS — H401132 Primary open-angle glaucoma, bilateral, moderate stage: Secondary | ICD-10-CM | POA: Diagnosis not present

## 2019-01-20 ENCOUNTER — Other Ambulatory Visit: Payer: Self-pay | Admitting: Internal Medicine

## 2019-01-20 DIAGNOSIS — Z1231 Encounter for screening mammogram for malignant neoplasm of breast: Secondary | ICD-10-CM

## 2019-02-26 ENCOUNTER — Ambulatory Visit
Admission: RE | Admit: 2019-02-26 | Discharge: 2019-02-26 | Disposition: A | Discharge status: Home / Self Care | Payer: PPO | Source: Ambulatory Visit | Attending: Internal Medicine | Admitting: Internal Medicine

## 2019-02-26 DIAGNOSIS — Z1231 Encounter for screening mammogram for malignant neoplasm of breast: Secondary | ICD-10-CM | POA: Insufficient documentation

## 2019-04-19 DIAGNOSIS — E782 Mixed hyperlipidemia: Secondary | ICD-10-CM | POA: Diagnosis not present

## 2019-04-19 DIAGNOSIS — R350 Frequency of micturition: Secondary | ICD-10-CM | POA: Diagnosis not present

## 2019-04-26 DIAGNOSIS — E782 Mixed hyperlipidemia: Secondary | ICD-10-CM | POA: Diagnosis not present

## 2019-04-26 DIAGNOSIS — I493 Ventricular premature depolarization: Secondary | ICD-10-CM | POA: Diagnosis not present

## 2019-05-05 DIAGNOSIS — H401132 Primary open-angle glaucoma, bilateral, moderate stage: Secondary | ICD-10-CM | POA: Diagnosis not present

## 2019-09-22 DIAGNOSIS — A09 Infectious gastroenteritis and colitis, unspecified: Secondary | ICD-10-CM | POA: Diagnosis not present

## 2019-09-22 DIAGNOSIS — E860 Dehydration: Secondary | ICD-10-CM | POA: Diagnosis not present

## 2019-09-24 DIAGNOSIS — A09 Infectious gastroenteritis and colitis, unspecified: Secondary | ICD-10-CM | POA: Diagnosis not present

## 2019-09-29 DIAGNOSIS — Z23 Encounter for immunization: Secondary | ICD-10-CM | POA: Diagnosis not present

## 2019-09-29 DIAGNOSIS — I493 Ventricular premature depolarization: Secondary | ICD-10-CM | POA: Diagnosis not present

## 2019-09-29 DIAGNOSIS — E860 Dehydration: Secondary | ICD-10-CM | POA: Diagnosis not present

## 2019-09-29 DIAGNOSIS — A09 Infectious gastroenteritis and colitis, unspecified: Secondary | ICD-10-CM | POA: Diagnosis not present

## 2019-10-19 DIAGNOSIS — E782 Mixed hyperlipidemia: Secondary | ICD-10-CM | POA: Diagnosis not present

## 2019-10-26 DIAGNOSIS — Z23 Encounter for immunization: Secondary | ICD-10-CM | POA: Diagnosis not present

## 2019-10-26 DIAGNOSIS — Z0000 Encounter for general adult medical examination without abnormal findings: Secondary | ICD-10-CM | POA: Diagnosis not present

## 2019-10-26 DIAGNOSIS — I493 Ventricular premature depolarization: Secondary | ICD-10-CM | POA: Diagnosis not present

## 2019-10-26 DIAGNOSIS — E782 Mixed hyperlipidemia: Secondary | ICD-10-CM | POA: Diagnosis not present

## 2019-10-28 ENCOUNTER — Other Ambulatory Visit: Payer: Self-pay | Admitting: Internal Medicine

## 2019-10-28 DIAGNOSIS — Z1231 Encounter for screening mammogram for malignant neoplasm of breast: Secondary | ICD-10-CM

## 2019-11-02 DIAGNOSIS — M8588 Other specified disorders of bone density and structure, other site: Secondary | ICD-10-CM | POA: Diagnosis not present

## 2019-11-02 DIAGNOSIS — Y92009 Unspecified place in unspecified non-institutional (private) residence as the place of occurrence of the external cause: Secondary | ICD-10-CM | POA: Diagnosis not present

## 2019-11-02 DIAGNOSIS — M47817 Spondylosis without myelopathy or radiculopathy, lumbosacral region: Secondary | ICD-10-CM | POA: Diagnosis not present

## 2019-11-02 DIAGNOSIS — M1612 Unilateral primary osteoarthritis, left hip: Secondary | ICD-10-CM | POA: Diagnosis not present

## 2019-11-02 DIAGNOSIS — M7918 Myalgia, other site: Secondary | ICD-10-CM | POA: Diagnosis not present

## 2019-11-02 DIAGNOSIS — W010XXA Fall on same level from slipping, tripping and stumbling without subsequent striking against object, initial encounter: Secondary | ICD-10-CM | POA: Diagnosis not present

## 2019-11-23 ENCOUNTER — Other Ambulatory Visit: Payer: Self-pay | Admitting: Gastroenterology

## 2019-11-23 DIAGNOSIS — R634 Abnormal weight loss: Secondary | ICD-10-CM | POA: Diagnosis not present

## 2019-11-23 DIAGNOSIS — R194 Change in bowel habit: Secondary | ICD-10-CM

## 2019-11-23 DIAGNOSIS — R197 Diarrhea, unspecified: Secondary | ICD-10-CM

## 2019-11-25 DIAGNOSIS — R197 Diarrhea, unspecified: Secondary | ICD-10-CM | POA: Diagnosis not present

## 2019-11-30 ENCOUNTER — Ambulatory Visit
Admission: RE | Admit: 2019-11-30 | Discharge: 2019-11-30 | Disposition: A | Discharge status: Home / Self Care | Payer: PPO | Source: Ambulatory Visit | Attending: Gastroenterology | Admitting: Gastroenterology

## 2019-11-30 ENCOUNTER — Other Ambulatory Visit: Payer: Self-pay

## 2019-11-30 DIAGNOSIS — K8020 Calculus of gallbladder without cholecystitis without obstruction: Secondary | ICD-10-CM | POA: Diagnosis not present

## 2019-11-30 DIAGNOSIS — R634 Abnormal weight loss: Secondary | ICD-10-CM

## 2019-11-30 DIAGNOSIS — S3210XA Unspecified fracture of sacrum, initial encounter for closed fracture: Secondary | ICD-10-CM | POA: Diagnosis not present

## 2019-11-30 DIAGNOSIS — S32592A Other specified fracture of left pubis, initial encounter for closed fracture: Secondary | ICD-10-CM | POA: Diagnosis not present

## 2019-11-30 DIAGNOSIS — R197 Diarrhea, unspecified: Secondary | ICD-10-CM | POA: Diagnosis not present

## 2019-11-30 DIAGNOSIS — R194 Change in bowel habit: Secondary | ICD-10-CM | POA: Diagnosis not present

## 2019-11-30 LAB — POCT I-STAT CREATININE: Creatinine, Ser: 1.1 mg/dL — ABNORMAL HIGH (ref 0.44–1.00)

## 2019-11-30 MED ORDER — IOHEXOL 300 MG/ML  SOLN
100.0000 mL | Freq: Once | INTRAMUSCULAR | Status: AC | PRN
  Administered 2019-11-30: 80 mL via INTRAVENOUS

## 2019-12-07 DIAGNOSIS — R102 Pelvic and perineal pain: Secondary | ICD-10-CM | POA: Diagnosis not present

## 2019-12-07 DIAGNOSIS — S32599A Other specified fracture of unspecified pubis, initial encounter for closed fracture: Secondary | ICD-10-CM | POA: Diagnosis not present

## 2020-01-05 DIAGNOSIS — S32599A Other specified fracture of unspecified pubis, initial encounter for closed fracture: Secondary | ICD-10-CM | POA: Diagnosis not present

## 2020-01-05 DIAGNOSIS — R102 Pelvic and perineal pain: Secondary | ICD-10-CM | POA: Diagnosis not present

## 2020-01-06 DIAGNOSIS — K580 Irritable bowel syndrome with diarrhea: Secondary | ICD-10-CM | POA: Diagnosis not present

## 2020-02-24 DIAGNOSIS — H401131 Primary open-angle glaucoma, bilateral, mild stage: Secondary | ICD-10-CM | POA: Diagnosis not present

## 2020-02-28 ENCOUNTER — Other Ambulatory Visit: Payer: Self-pay

## 2020-02-28 ENCOUNTER — Ambulatory Visit
Admission: RE | Admit: 2020-02-28 | Discharge: 2020-02-28 | Disposition: A | Discharge status: Home / Self Care | Payer: PPO | Source: Ambulatory Visit | Attending: Internal Medicine | Admitting: Internal Medicine

## 2020-02-28 DIAGNOSIS — Z1231 Encounter for screening mammogram for malignant neoplasm of breast: Secondary | ICD-10-CM | POA: Diagnosis not present

## 2020-03-29 DIAGNOSIS — R197 Diarrhea, unspecified: Secondary | ICD-10-CM | POA: Diagnosis not present

## 2020-03-29 DIAGNOSIS — E860 Dehydration: Secondary | ICD-10-CM | POA: Diagnosis not present

## 2020-03-31 DIAGNOSIS — R197 Diarrhea, unspecified: Secondary | ICD-10-CM | POA: Diagnosis not present

## 2020-04-05 DIAGNOSIS — R197 Diarrhea, unspecified: Secondary | ICD-10-CM | POA: Diagnosis not present

## 2020-04-18 DIAGNOSIS — E782 Mixed hyperlipidemia: Secondary | ICD-10-CM | POA: Diagnosis not present

## 2020-04-25 DIAGNOSIS — Z0000 Encounter for general adult medical examination without abnormal findings: Secondary | ICD-10-CM | POA: Diagnosis not present

## 2020-04-25 DIAGNOSIS — I493 Ventricular premature depolarization: Secondary | ICD-10-CM | POA: Diagnosis not present

## 2020-04-25 DIAGNOSIS — E782 Mixed hyperlipidemia: Secondary | ICD-10-CM | POA: Diagnosis not present

## 2020-04-25 DIAGNOSIS — K580 Irritable bowel syndrome with diarrhea: Secondary | ICD-10-CM | POA: Diagnosis not present

## 2020-05-16 DIAGNOSIS — K580 Irritable bowel syndrome with diarrhea: Secondary | ICD-10-CM | POA: Diagnosis not present

## 2020-08-24 DIAGNOSIS — H401131 Primary open-angle glaucoma, bilateral, mild stage: Secondary | ICD-10-CM | POA: Diagnosis not present

## 2020-10-20 DIAGNOSIS — K580 Irritable bowel syndrome with diarrhea: Secondary | ICD-10-CM | POA: Diagnosis not present

## 2020-10-20 DIAGNOSIS — E782 Mixed hyperlipidemia: Secondary | ICD-10-CM | POA: Diagnosis not present

## 2020-10-20 DIAGNOSIS — I493 Ventricular premature depolarization: Secondary | ICD-10-CM | POA: Diagnosis not present

## 2020-10-24 DIAGNOSIS — E782 Mixed hyperlipidemia: Secondary | ICD-10-CM | POA: Diagnosis not present

## 2020-10-27 DIAGNOSIS — Z23 Encounter for immunization: Secondary | ICD-10-CM | POA: Diagnosis not present

## 2020-10-27 DIAGNOSIS — Z0000 Encounter for general adult medical examination without abnormal findings: Secondary | ICD-10-CM | POA: Diagnosis not present

## 2020-10-27 DIAGNOSIS — E782 Mixed hyperlipidemia: Secondary | ICD-10-CM | POA: Diagnosis not present

## 2020-10-27 DIAGNOSIS — K580 Irritable bowel syndrome with diarrhea: Secondary | ICD-10-CM | POA: Diagnosis not present

## 2020-10-27 DIAGNOSIS — G609 Hereditary and idiopathic neuropathy, unspecified: Secondary | ICD-10-CM | POA: Diagnosis not present

## 2020-11-20 ENCOUNTER — Other Ambulatory Visit: Payer: Self-pay | Admitting: Internal Medicine

## 2020-11-20 DIAGNOSIS — Z1231 Encounter for screening mammogram for malignant neoplasm of breast: Secondary | ICD-10-CM

## 2020-11-27 ENCOUNTER — Other Ambulatory Visit: Payer: Self-pay | Admitting: Internal Medicine

## 2020-11-27 DIAGNOSIS — Z1231 Encounter for screening mammogram for malignant neoplasm of breast: Secondary | ICD-10-CM

## 2021-03-08 DIAGNOSIS — H401131 Primary open-angle glaucoma, bilateral, mild stage: Secondary | ICD-10-CM | POA: Diagnosis not present

## 2021-04-04 DIAGNOSIS — R197 Diarrhea, unspecified: Secondary | ICD-10-CM | POA: Diagnosis not present

## 2021-04-24 DIAGNOSIS — E782 Mixed hyperlipidemia: Secondary | ICD-10-CM | POA: Diagnosis not present

## 2021-04-25 ENCOUNTER — Ambulatory Visit
Admission: RE | Admit: 2021-04-25 | Discharge: 2021-04-25 | Disposition: A | Discharge status: Home / Self Care | Payer: PPO | Source: Ambulatory Visit | Attending: Internal Medicine | Admitting: Internal Medicine

## 2021-04-25 DIAGNOSIS — Z1231 Encounter for screening mammogram for malignant neoplasm of breast: Secondary | ICD-10-CM | POA: Insufficient documentation

## 2021-05-01 DIAGNOSIS — I10 Essential (primary) hypertension: Secondary | ICD-10-CM | POA: Diagnosis not present

## 2021-05-01 DIAGNOSIS — E782 Mixed hyperlipidemia: Secondary | ICD-10-CM | POA: Diagnosis not present

## 2021-05-01 DIAGNOSIS — Z0000 Encounter for general adult medical examination without abnormal findings: Secondary | ICD-10-CM | POA: Diagnosis not present

## 2021-05-01 DIAGNOSIS — B349 Viral infection, unspecified: Secondary | ICD-10-CM | POA: Diagnosis not present

## 2021-05-01 DIAGNOSIS — G609 Hereditary and idiopathic neuropathy, unspecified: Secondary | ICD-10-CM | POA: Diagnosis not present

## 2021-09-13 DIAGNOSIS — H401131 Primary open-angle glaucoma, bilateral, mild stage: Secondary | ICD-10-CM | POA: Diagnosis not present

## 2021-11-02 DIAGNOSIS — E782 Mixed hyperlipidemia: Secondary | ICD-10-CM | POA: Diagnosis not present

## 2021-11-02 DIAGNOSIS — G609 Hereditary and idiopathic neuropathy, unspecified: Secondary | ICD-10-CM | POA: Diagnosis not present

## 2021-11-09 DIAGNOSIS — G609 Hereditary and idiopathic neuropathy, unspecified: Secondary | ICD-10-CM | POA: Diagnosis not present

## 2021-11-09 DIAGNOSIS — Z23 Encounter for immunization: Secondary | ICD-10-CM | POA: Diagnosis not present

## 2021-11-09 DIAGNOSIS — Z0000 Encounter for general adult medical examination without abnormal findings: Secondary | ICD-10-CM | POA: Diagnosis not present

## 2021-11-09 DIAGNOSIS — E782 Mixed hyperlipidemia: Secondary | ICD-10-CM | POA: Diagnosis not present

## 2021-11-09 DIAGNOSIS — I10 Essential (primary) hypertension: Secondary | ICD-10-CM | POA: Diagnosis not present

## 2021-11-27 ENCOUNTER — Other Ambulatory Visit: Payer: Self-pay | Admitting: Internal Medicine

## 2021-11-27 DIAGNOSIS — Z1231 Encounter for screening mammogram for malignant neoplasm of breast: Secondary | ICD-10-CM

## 2022-02-04 DIAGNOSIS — A0472 Enterocolitis due to Clostridium difficile, not specified as recurrent: Secondary | ICD-10-CM | POA: Diagnosis not present

## 2022-02-05 DIAGNOSIS — A0472 Enterocolitis due to Clostridium difficile, not specified as recurrent: Secondary | ICD-10-CM | POA: Diagnosis not present

## 2022-03-14 DIAGNOSIS — H401131 Primary open-angle glaucoma, bilateral, mild stage: Secondary | ICD-10-CM | POA: Diagnosis not present

## 2022-04-30 ENCOUNTER — Ambulatory Visit
Admission: RE | Admit: 2022-04-30 | Discharge: 2022-04-30 | Disposition: A | Discharge status: Home / Self Care | Payer: PPO | Source: Ambulatory Visit | Attending: Internal Medicine | Admitting: Internal Medicine

## 2022-04-30 DIAGNOSIS — Z1231 Encounter for screening mammogram for malignant neoplasm of breast: Secondary | ICD-10-CM | POA: Diagnosis not present

## 2022-05-07 DIAGNOSIS — E782 Mixed hyperlipidemia: Secondary | ICD-10-CM | POA: Diagnosis not present

## 2022-05-07 DIAGNOSIS — G609 Hereditary and idiopathic neuropathy, unspecified: Secondary | ICD-10-CM | POA: Diagnosis not present

## 2022-05-14 DIAGNOSIS — E039 Hypothyroidism, unspecified: Secondary | ICD-10-CM | POA: Diagnosis not present

## 2022-05-14 DIAGNOSIS — R739 Hyperglycemia, unspecified: Secondary | ICD-10-CM | POA: Diagnosis not present

## 2022-05-14 DIAGNOSIS — G609 Hereditary and idiopathic neuropathy, unspecified: Secondary | ICD-10-CM | POA: Diagnosis not present

## 2022-05-14 DIAGNOSIS — E782 Mixed hyperlipidemia: Secondary | ICD-10-CM | POA: Diagnosis not present

## 2022-05-14 DIAGNOSIS — Z0000 Encounter for general adult medical examination without abnormal findings: Secondary | ICD-10-CM | POA: Diagnosis not present

## 2022-07-09 IMAGING — MG MM DIGITAL SCREENING BILAT W/ TOMO AND CAD
6 of 10 series · 6 of 30 positions shown · non-contrast
Comparison: Previous exam(s).

CLINICAL DATA: Screening.

EXAM:
DIGITAL SCREENING BILATERAL MAMMOGRAM WITH TOMOSYNTHESIS AND CAD
TECHNIQUE: Bilateral screening digital craniocaudal and mediolateral oblique
mammograms were obtained. Bilateral screening digital breast
tomosynthesis was performed. The images were evaluated with
computer-aided detection.

[L CC synth-2D]
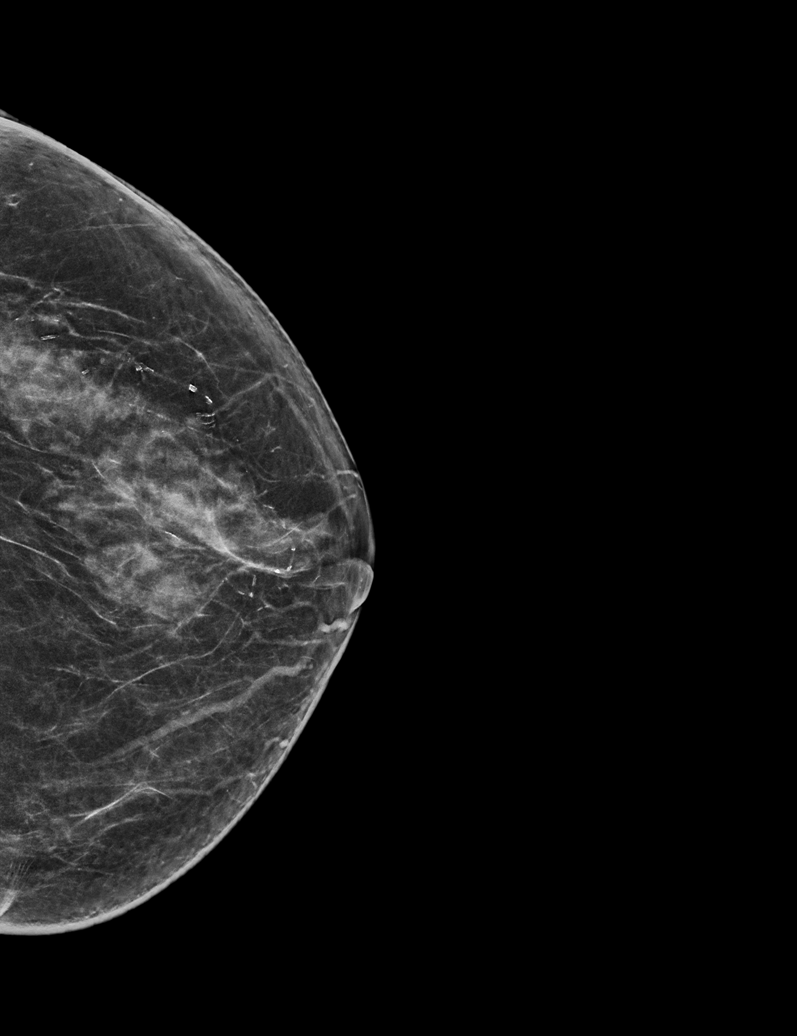

[R MLO synth-2D (1 of 2)]
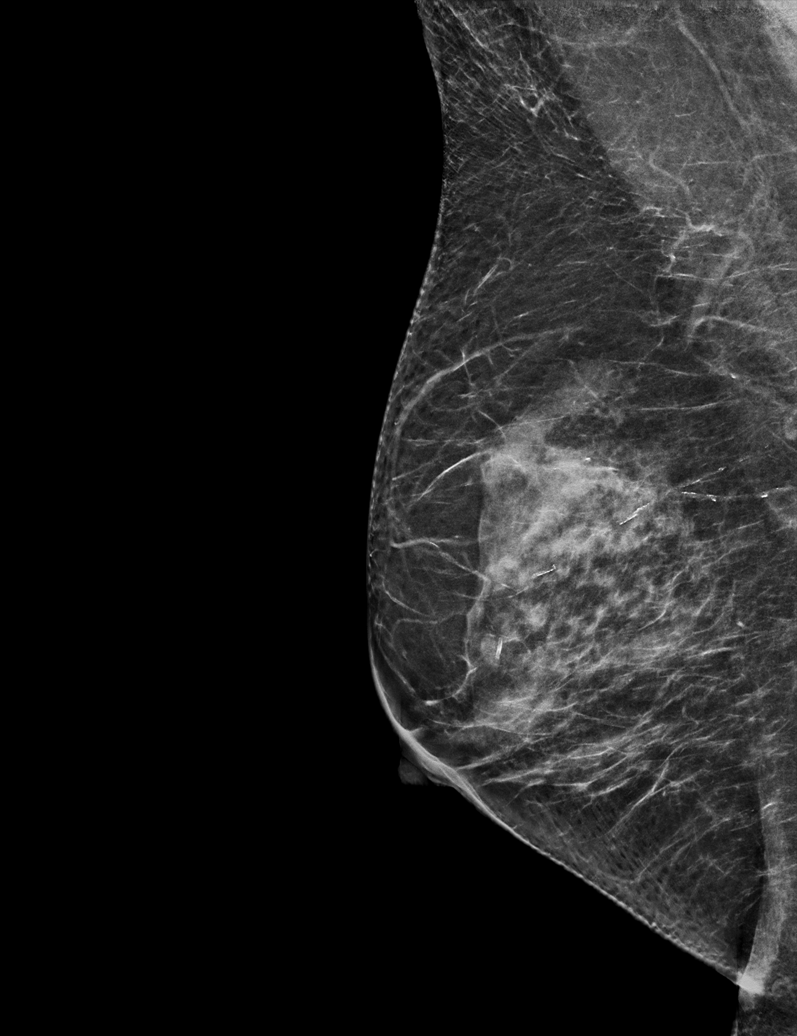

[R CC synth-2D]
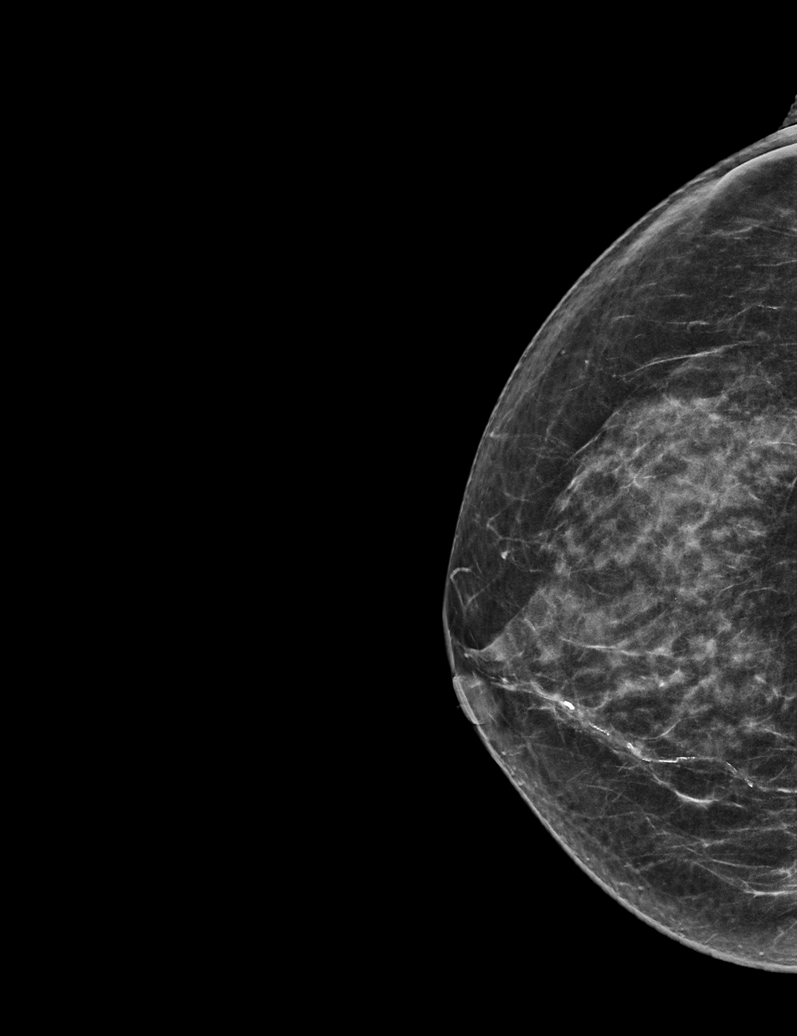

[L MLO synth-2D]
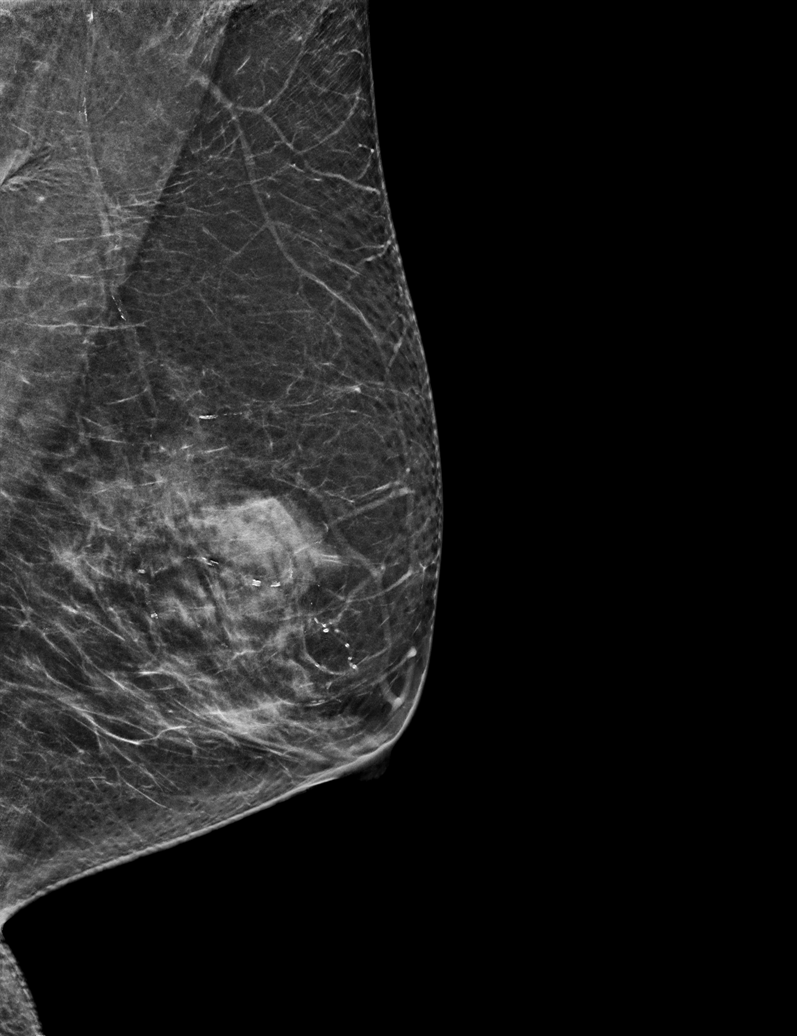

[R MLO synth-2D (2 of 2)]
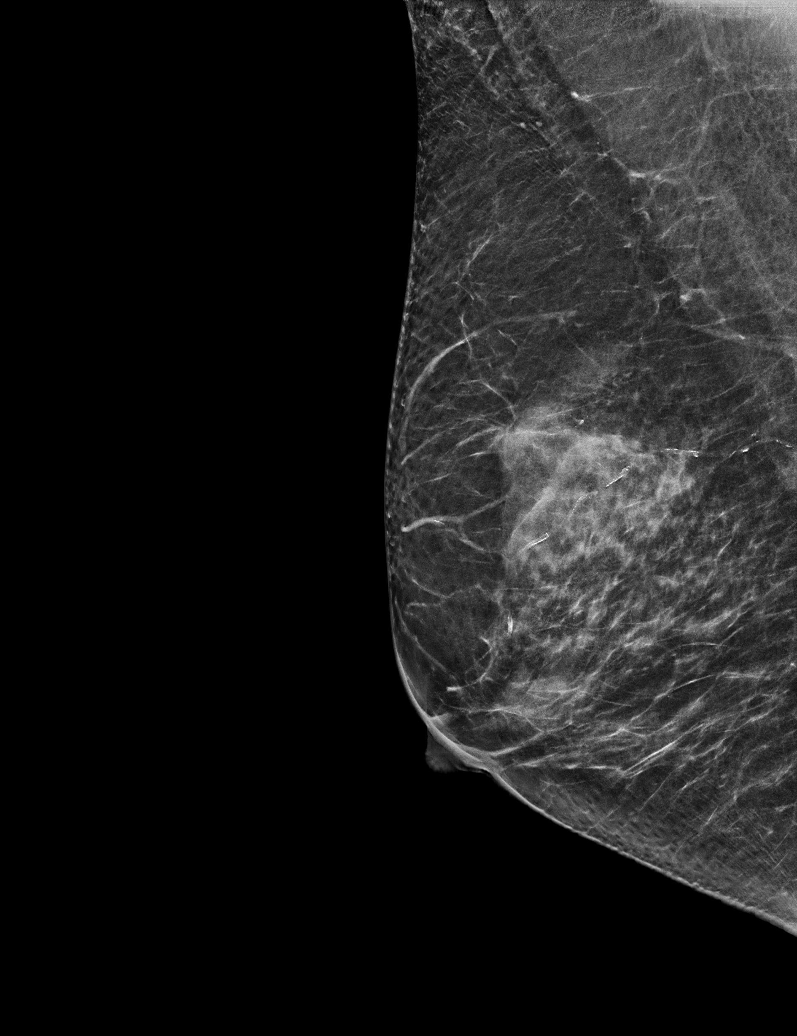

[R MLO tomo · tomo slice 35/68.0]
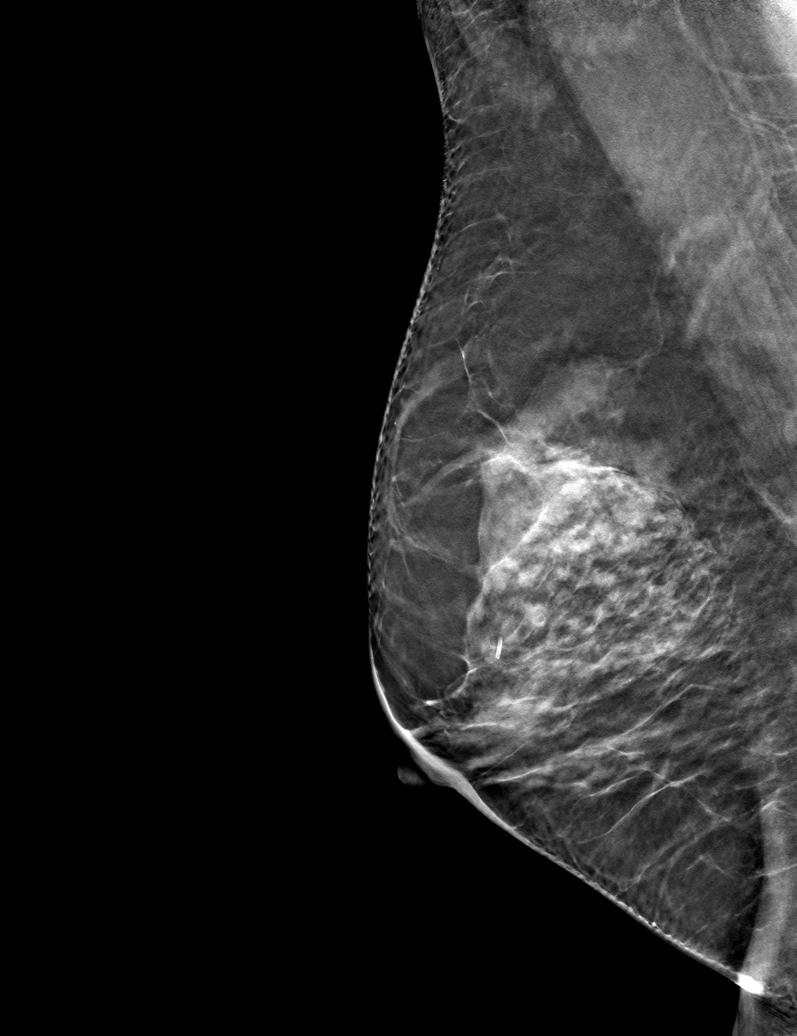

[6 of 30 positions shown; findings below may reference images not displayed]

ACR Breast Density Category c: The breast tissue is heterogeneously
dense, which may obscure small masses.
FINDINGS: There are no findings suspicious for malignancy.
IMPRESSION: No mammographic evidence of malignancy. A result letter of this
screening mammogram will be mailed directly to the patient.

RECOMMENDATION:
Screening mammogram in one year. (Code:Q3-W-BC3)

BI-RADS CATEGORY  1: Negative.

## 2022-08-01 DIAGNOSIS — N390 Urinary tract infection, site not specified: Secondary | ICD-10-CM | POA: Diagnosis not present

## 2022-09-13 DIAGNOSIS — H401131 Primary open-angle glaucoma, bilateral, mild stage: Secondary | ICD-10-CM | POA: Diagnosis not present

## 2022-09-19 DIAGNOSIS — H401131 Primary open-angle glaucoma, bilateral, mild stage: Secondary | ICD-10-CM | POA: Diagnosis not present

## 2022-09-19 DIAGNOSIS — Z961 Presence of intraocular lens: Secondary | ICD-10-CM | POA: Diagnosis not present

## 2022-09-19 DIAGNOSIS — H43813 Vitreous degeneration, bilateral: Secondary | ICD-10-CM | POA: Diagnosis not present

## 2022-10-28 DIAGNOSIS — D225 Melanocytic nevi of trunk: Secondary | ICD-10-CM | POA: Diagnosis not present

## 2022-10-28 DIAGNOSIS — D2262 Melanocytic nevi of left upper limb, including shoulder: Secondary | ICD-10-CM | POA: Diagnosis not present

## 2022-10-28 DIAGNOSIS — D2271 Melanocytic nevi of right lower limb, including hip: Secondary | ICD-10-CM | POA: Diagnosis not present

## 2022-10-28 DIAGNOSIS — D2272 Melanocytic nevi of left lower limb, including hip: Secondary | ICD-10-CM | POA: Diagnosis not present

## 2022-10-28 DIAGNOSIS — L821 Other seborrheic keratosis: Secondary | ICD-10-CM | POA: Diagnosis not present

## 2022-10-28 DIAGNOSIS — L309 Dermatitis, unspecified: Secondary | ICD-10-CM | POA: Diagnosis not present

## 2022-10-28 DIAGNOSIS — D2261 Melanocytic nevi of right upper limb, including shoulder: Secondary | ICD-10-CM | POA: Diagnosis not present

## 2022-10-28 DIAGNOSIS — L708 Other acne: Secondary | ICD-10-CM | POA: Diagnosis not present

## 2022-10-28 DIAGNOSIS — L308 Other specified dermatitis: Secondary | ICD-10-CM | POA: Diagnosis not present

## 2022-11-05 DIAGNOSIS — R739 Hyperglycemia, unspecified: Secondary | ICD-10-CM | POA: Diagnosis not present

## 2022-11-05 DIAGNOSIS — E782 Mixed hyperlipidemia: Secondary | ICD-10-CM | POA: Diagnosis not present

## 2022-11-05 DIAGNOSIS — G609 Hereditary and idiopathic neuropathy, unspecified: Secondary | ICD-10-CM | POA: Diagnosis not present

## 2022-11-12 DIAGNOSIS — Z23 Encounter for immunization: Secondary | ICD-10-CM | POA: Diagnosis not present

## 2022-11-12 DIAGNOSIS — Z0000 Encounter for general adult medical examination without abnormal findings: Secondary | ICD-10-CM | POA: Diagnosis not present

## 2022-11-12 DIAGNOSIS — E782 Mixed hyperlipidemia: Secondary | ICD-10-CM | POA: Diagnosis not present

## 2023-02-13 ENCOUNTER — Other Ambulatory Visit: Payer: Self-pay | Admitting: Internal Medicine

## 2023-02-13 DIAGNOSIS — Z1231 Encounter for screening mammogram for malignant neoplasm of breast: Secondary | ICD-10-CM

## 2023-02-25 DIAGNOSIS — J019 Acute sinusitis, unspecified: Secondary | ICD-10-CM | POA: Diagnosis not present

## 2023-04-03 DIAGNOSIS — H401131 Primary open-angle glaucoma, bilateral, mild stage: Secondary | ICD-10-CM | POA: Diagnosis not present

## 2023-04-03 DIAGNOSIS — H18513 Endothelial corneal dystrophy, bilateral: Secondary | ICD-10-CM | POA: Diagnosis not present

## 2023-04-03 DIAGNOSIS — Z961 Presence of intraocular lens: Secondary | ICD-10-CM | POA: Diagnosis not present

## 2023-05-06 ENCOUNTER — Ambulatory Visit
Admission: RE | Admit: 2023-05-06 | Discharge: 2023-05-06 | Disposition: A | Discharge status: Home / Self Care | Payer: PPO | Source: Ambulatory Visit | Attending: Internal Medicine | Admitting: Internal Medicine

## 2023-05-06 DIAGNOSIS — Z1231 Encounter for screening mammogram for malignant neoplasm of breast: Secondary | ICD-10-CM | POA: Insufficient documentation

## 2023-05-06 DIAGNOSIS — E782 Mixed hyperlipidemia: Secondary | ICD-10-CM | POA: Diagnosis not present

## 2023-05-13 ENCOUNTER — Ambulatory Visit
Admission: RE | Admit: 2023-05-13 | Discharge: 2023-05-13 | Disposition: A | Discharge status: Home / Self Care | Source: Ambulatory Visit | Attending: Internal Medicine | Admitting: Internal Medicine

## 2023-05-13 ENCOUNTER — Other Ambulatory Visit: Payer: Self-pay | Admitting: Internal Medicine

## 2023-05-13 DIAGNOSIS — Z0000 Encounter for general adult medical examination without abnormal findings: Secondary | ICD-10-CM | POA: Diagnosis not present

## 2023-05-13 DIAGNOSIS — R739 Hyperglycemia, unspecified: Secondary | ICD-10-CM | POA: Diagnosis not present

## 2023-05-13 DIAGNOSIS — K352 Acute appendicitis with generalized peritonitis, without perforation or abscess: Secondary | ICD-10-CM | POA: Insufficient documentation

## 2023-05-13 DIAGNOSIS — K8020 Calculus of gallbladder without cholecystitis without obstruction: Secondary | ICD-10-CM | POA: Diagnosis not present

## 2023-05-13 DIAGNOSIS — R109 Unspecified abdominal pain: Secondary | ICD-10-CM | POA: Diagnosis not present

## 2023-05-13 DIAGNOSIS — K35891 Other acute appendicitis without perforation, with gangrene: Secondary | ICD-10-CM | POA: Diagnosis not present

## 2023-05-13 DIAGNOSIS — G609 Hereditary and idiopathic neuropathy, unspecified: Secondary | ICD-10-CM | POA: Diagnosis not present

## 2023-05-13 DIAGNOSIS — E782 Mixed hyperlipidemia: Secondary | ICD-10-CM | POA: Diagnosis not present

## 2023-05-13 DIAGNOSIS — K5730 Diverticulosis of large intestine without perforation or abscess without bleeding: Secondary | ICD-10-CM | POA: Diagnosis not present

## 2023-05-27 DIAGNOSIS — I10 Essential (primary) hypertension: Secondary | ICD-10-CM | POA: Diagnosis not present

## 2023-05-27 DIAGNOSIS — K5792 Diverticulitis of intestine, part unspecified, without perforation or abscess without bleeding: Secondary | ICD-10-CM | POA: Diagnosis not present

## 2023-09-01 DIAGNOSIS — R300 Dysuria: Secondary | ICD-10-CM | POA: Diagnosis not present

## 2023-10-02 DIAGNOSIS — H401131 Primary open-angle glaucoma, bilateral, mild stage: Secondary | ICD-10-CM | POA: Diagnosis not present

## 2023-10-09 DIAGNOSIS — H04203 Unspecified epiphora, bilateral lacrimal glands: Secondary | ICD-10-CM | POA: Diagnosis not present

## 2023-10-09 DIAGNOSIS — Z961 Presence of intraocular lens: Secondary | ICD-10-CM | POA: Diagnosis not present

## 2023-10-09 DIAGNOSIS — H401131 Primary open-angle glaucoma, bilateral, mild stage: Secondary | ICD-10-CM | POA: Diagnosis not present

## 2023-10-09 DIAGNOSIS — H18513 Endothelial corneal dystrophy, bilateral: Secondary | ICD-10-CM | POA: Diagnosis not present

## 2023-11-06 DIAGNOSIS — R739 Hyperglycemia, unspecified: Secondary | ICD-10-CM | POA: Diagnosis not present

## 2023-11-06 DIAGNOSIS — E782 Mixed hyperlipidemia: Secondary | ICD-10-CM | POA: Diagnosis not present

## 2023-11-13 DIAGNOSIS — E782 Mixed hyperlipidemia: Secondary | ICD-10-CM | POA: Diagnosis not present

## 2023-11-13 DIAGNOSIS — Z79899 Other long term (current) drug therapy: Secondary | ICD-10-CM | POA: Diagnosis not present

## 2023-11-13 DIAGNOSIS — I493 Ventricular premature depolarization: Secondary | ICD-10-CM | POA: Diagnosis not present

## 2023-11-13 DIAGNOSIS — Z23 Encounter for immunization: Secondary | ICD-10-CM | POA: Diagnosis not present

## 2023-11-13 DIAGNOSIS — Z0000 Encounter for general adult medical examination without abnormal findings: Secondary | ICD-10-CM | POA: Diagnosis not present

## 2023-11-13 DIAGNOSIS — R739 Hyperglycemia, unspecified: Secondary | ICD-10-CM | POA: Diagnosis not present

## 2024-01-26 ENCOUNTER — Other Ambulatory Visit: Payer: Self-pay | Admitting: Internal Medicine

## 2024-01-26 DIAGNOSIS — Z1231 Encounter for screening mammogram for malignant neoplasm of breast: Secondary | ICD-10-CM

## 2024-05-06 ENCOUNTER — Encounter
